# Patient Record
Sex: Female | Born: 1940 | Race: White | Hispanic: No
Health system: Southern US, Community
[De-identification: ages and names within clinical notes are randomized; demographics above are authoritative.]

---

## 1997-09-28 ENCOUNTER — Other Ambulatory Visit: Admission: RE | Admit: 1997-09-28 | Discharge: 1997-09-28 | Payer: Self-pay | Admitting: *Deleted

## 1997-11-02 ENCOUNTER — Ambulatory Visit: Admission: RE | Admit: 1997-11-02 | Discharge: 1997-11-02 | Payer: Self-pay | Admitting: Cardiology

## 1998-10-03 ENCOUNTER — Other Ambulatory Visit: Admission: RE | Admit: 1998-10-03 | Discharge: 1998-10-03 | Payer: Self-pay | Admitting: *Deleted

## 1999-03-05 ENCOUNTER — Emergency Department (HOSPITAL_COMMUNITY): Admission: EM | Admit: 1999-03-05 | Discharge: 1999-03-05 | Payer: Self-pay

## 1999-04-27 ENCOUNTER — Ambulatory Visit (HOSPITAL_COMMUNITY): Admission: RE | Admit: 1999-04-27 | Discharge: 1999-04-27 | Payer: Self-pay | Admitting: *Deleted

## 1999-10-04 ENCOUNTER — Other Ambulatory Visit: Admission: RE | Admit: 1999-10-04 | Discharge: 1999-10-04 | Payer: Self-pay | Admitting: *Deleted

## 1999-10-27 ENCOUNTER — Encounter: Admission: RE | Admit: 1999-10-27 | Discharge: 1999-10-27 | Payer: Self-pay | Admitting: Internal Medicine

## 1999-10-27 ENCOUNTER — Encounter: Payer: Self-pay | Admitting: Internal Medicine

## 2000-10-29 ENCOUNTER — Encounter: Admission: RE | Admit: 2000-10-29 | Discharge: 2000-10-29 | Payer: Self-pay | Admitting: Internal Medicine

## 2000-10-29 ENCOUNTER — Encounter: Payer: Self-pay | Admitting: Internal Medicine

## 2001-10-30 ENCOUNTER — Encounter: Payer: Self-pay | Admitting: *Deleted

## 2001-10-30 ENCOUNTER — Encounter: Admission: RE | Admit: 2001-10-30 | Discharge: 2001-10-30 | Payer: Self-pay | Admitting: *Deleted

## 2002-11-02 ENCOUNTER — Encounter: Payer: Self-pay | Admitting: Obstetrics and Gynecology

## 2002-11-02 ENCOUNTER — Encounter: Admission: RE | Admit: 2002-11-02 | Discharge: 2002-11-02 | Payer: Self-pay | Admitting: Obstetrics and Gynecology

## 2003-11-03 ENCOUNTER — Encounter: Admission: RE | Admit: 2003-11-03 | Discharge: 2003-11-03 | Payer: Self-pay | Admitting: Obstetrics and Gynecology

## 2004-06-29 ENCOUNTER — Ambulatory Visit (HOSPITAL_COMMUNITY): Admission: RE | Admit: 2004-06-29 | Discharge: 2004-06-29 | Payer: Self-pay | Admitting: *Deleted

## 2004-11-15 ENCOUNTER — Encounter: Admission: RE | Admit: 2004-11-15 | Discharge: 2004-11-15 | Payer: Self-pay | Admitting: Internal Medicine

## 2005-11-20 ENCOUNTER — Encounter: Admission: RE | Admit: 2005-11-20 | Discharge: 2005-11-20 | Payer: Self-pay | Admitting: Internal Medicine

## 2006-11-22 ENCOUNTER — Encounter: Admission: RE | Admit: 2006-11-22 | Discharge: 2006-11-22 | Payer: Self-pay | Admitting: Internal Medicine

## 2007-11-25 ENCOUNTER — Encounter: Admission: RE | Admit: 2007-11-25 | Discharge: 2007-11-25 | Payer: Self-pay | Admitting: Internal Medicine

## 2008-11-25 ENCOUNTER — Encounter: Admission: RE | Admit: 2008-11-25 | Discharge: 2008-11-25 | Payer: Self-pay | Admitting: Internal Medicine

## 2009-11-28 ENCOUNTER — Encounter: Admission: RE | Admit: 2009-11-28 | Discharge: 2009-11-28 | Payer: Self-pay | Admitting: Internal Medicine

## 2010-08-04 NOTE — Op Note (Signed)
NAMEATLAS, KUC                  ACCOUNT NO.:  [id number]   MEDICAL RECORD NO.:  [id number]          PATIENT TYPE:  AMB   LOCATION:  ENDO                         FACILITY:  [facility]   PHYSICIAN:  Georgiana Spinner, M.D.    DATE OF BIRTH:  [birth year]   DATE OF PROCEDURE:  06/29/2004  DATE OF DISCHARGE:                                 OPERATIVE REPORT   PROCEDURE:  Colonoscopy.   INDICATIONS:  Cancer screening.   ANESTHESIA:  1.  Demerol 60.  2.  Versed 4 mg.   PROCEDURE:  With the patient mildly sedated in the left lateral decubitus  position, the Olympus video  colonoscope was inserted into the rectum and  passed under direct vision to the cecum, identified by the ileocecal valve  and appendiceal orifice, both of which were photographed.  From this point,  the colonoscope was slowly withdrawn, taking circumferential views of the  colonic mucosa, stopping in the rectum which appeared normal on direct and  showed hemorrhoids on retroflexed views.  The endoscope was straightened and  withdrawn.  The patient's vital signs and pulse oximeter remained stable.  The patient tolerated the procedure well, with no apparent complications.   FINDINGS:  Internal hemorrhoids.  Otherwise unremarkable colonoscopic  examination to the cecum.   PLAN:  Consider repeat examination in 5-10 years.      GMO/MEDQ  D:  06/29/2004  T:  06/29/2004  Job:  [id number]

## 2010-10-31 ENCOUNTER — Other Ambulatory Visit: Payer: Self-pay | Admitting: Internal Medicine

## 2010-10-31 DIAGNOSIS — Z1231 Encounter for screening mammogram for malignant neoplasm of breast: Secondary | ICD-10-CM

## 2010-12-01 ENCOUNTER — Ambulatory Visit
Admission: RE | Admit: 2010-12-01 | Discharge: 2010-12-01 | Disposition: A | Discharge status: Home / Self Care | Payer: Medicare Other | Source: Ambulatory Visit | Attending: Internal Medicine | Admitting: Internal Medicine

## 2010-12-01 DIAGNOSIS — Z1231 Encounter for screening mammogram for malignant neoplasm of breast: Secondary | ICD-10-CM

## 2011-10-22 ENCOUNTER — Other Ambulatory Visit: Payer: Self-pay | Admitting: Internal Medicine

## 2011-10-22 DIAGNOSIS — Z1231 Encounter for screening mammogram for malignant neoplasm of breast: Secondary | ICD-10-CM

## 2011-12-10 ENCOUNTER — Ambulatory Visit
Admission: RE | Admit: 2011-12-10 | Discharge: 2011-12-10 | Disposition: A | Discharge status: Home / Self Care | Payer: Medicare Other | Source: Ambulatory Visit | Attending: Internal Medicine | Admitting: Internal Medicine

## 2011-12-10 DIAGNOSIS — Z1231 Encounter for screening mammogram for malignant neoplasm of breast: Secondary | ICD-10-CM

## 2012-06-18 ENCOUNTER — Other Ambulatory Visit (HOSPITAL_COMMUNITY)
Admission: RE | Admit: 2012-06-18 | Discharge: 2012-06-18 | Disposition: A | Discharge status: Home / Self Care | Payer: Medicare Other | Source: Ambulatory Visit | Attending: Internal Medicine | Admitting: Internal Medicine

## 2012-06-18 DIAGNOSIS — Z01419 Encounter for gynecological examination (general) (routine) without abnormal findings: Secondary | ICD-10-CM | POA: Insufficient documentation

## 2012-06-18 DIAGNOSIS — Z1151 Encounter for screening for human papillomavirus (HPV): Secondary | ICD-10-CM | POA: Insufficient documentation

## 2012-11-03 ENCOUNTER — Other Ambulatory Visit: Payer: Self-pay

## 2012-11-03 DIAGNOSIS — Z1231 Encounter for screening mammogram for malignant neoplasm of breast: Secondary | ICD-10-CM

## 2012-12-10 ENCOUNTER — Ambulatory Visit
Admission: RE | Admit: 2012-12-10 | Discharge: 2012-12-10 | Disposition: A | Discharge status: Home / Self Care | Payer: Medicare Other | Source: Ambulatory Visit

## 2012-12-10 DIAGNOSIS — Z1231 Encounter for screening mammogram for malignant neoplasm of breast: Secondary | ICD-10-CM

## 2013-08-14 ENCOUNTER — Other Ambulatory Visit: Payer: Self-pay | Admitting: Internal Medicine

## 2013-08-17 ENCOUNTER — Ambulatory Visit
Admission: RE | Admit: 2013-08-17 | Discharge: 2013-08-17 | Disposition: A | Discharge status: Home / Self Care | Payer: Medicare Other | Source: Ambulatory Visit | Attending: Internal Medicine | Admitting: Internal Medicine

## 2013-11-06 ENCOUNTER — Other Ambulatory Visit: Payer: Self-pay

## 2013-11-06 DIAGNOSIS — Z1231 Encounter for screening mammogram for malignant neoplasm of breast: Secondary | ICD-10-CM

## 2013-12-11 ENCOUNTER — Ambulatory Visit
Admission: RE | Admit: 2013-12-11 | Discharge: 2013-12-11 | Disposition: A | Discharge status: Home / Self Care | Payer: Medicare Other | Source: Ambulatory Visit

## 2013-12-11 DIAGNOSIS — Z1231 Encounter for screening mammogram for malignant neoplasm of breast: Secondary | ICD-10-CM

## 2015-06-04 IMAGING — US US CAROTID DUPLEX BILAT
1 series · 13 of 24 positions shown · non-contrast
Comparison: None.

CLINICAL DATA: Calcification is noted in the neck on a plain
radiographs of the cervical spine suggests atherosclerotic disease
in the carotid arteries

EXAM:
BILATERAL CAROTID DUPLEX ULTRASOUND
TECHNIQUE: Gray scale imaging, color Doppler and duplex ultrasound were
performed of bilateral carotid and vertebral arteries in the neck.

[Series 1: us carotid duplex bilat · 0.10mm/px · 13 of 71 slices shown]
[im 1/71]
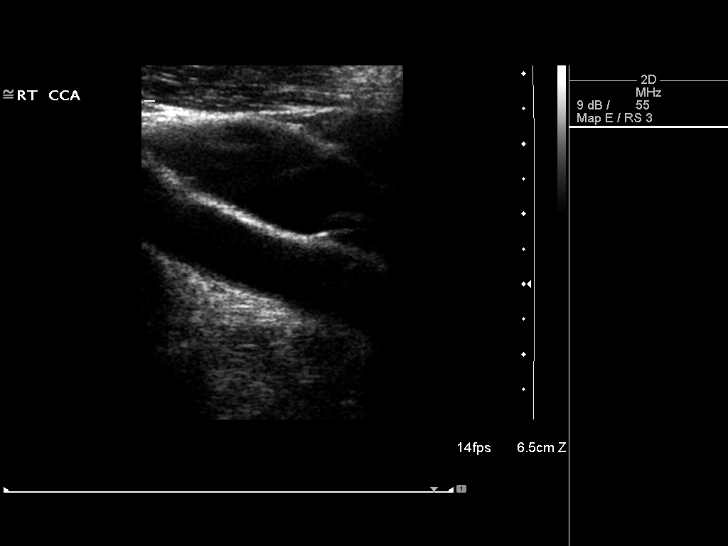
[im 7/71]
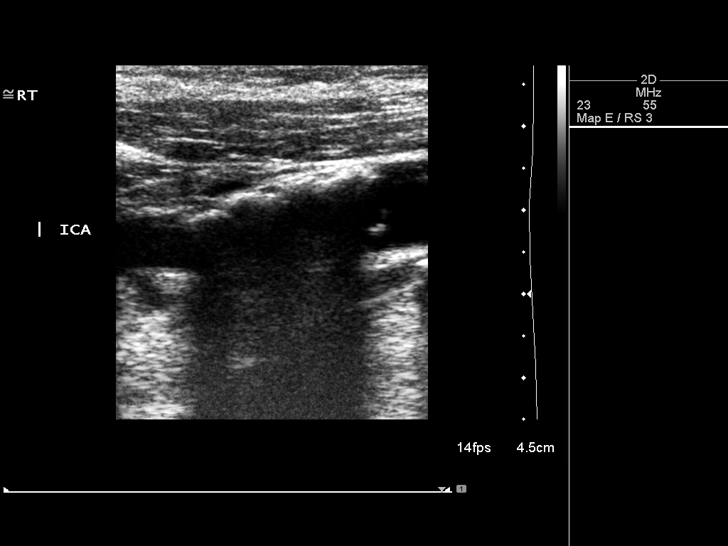
[im 13/71]
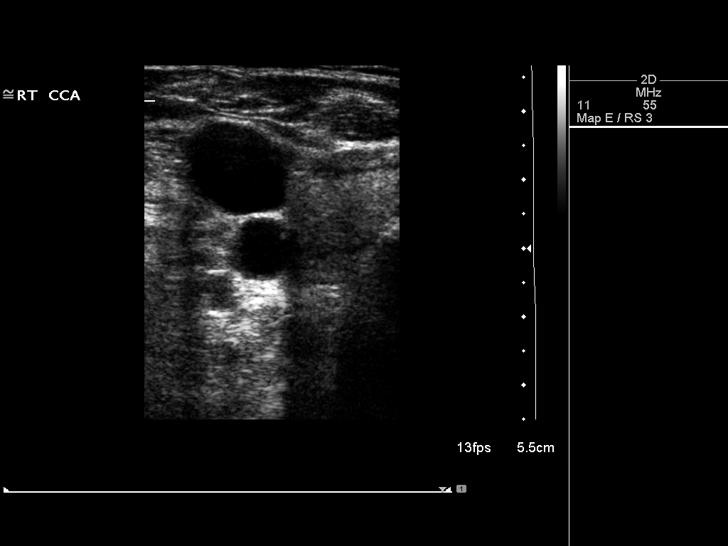
[im 19/71]
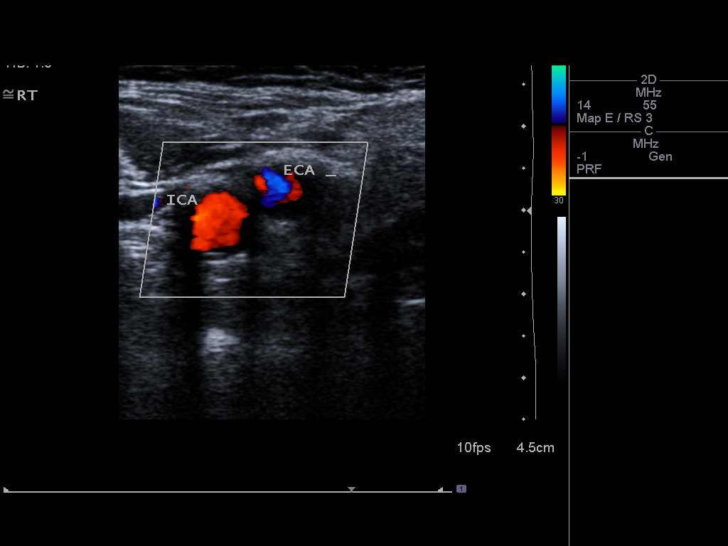
[im 25/71]
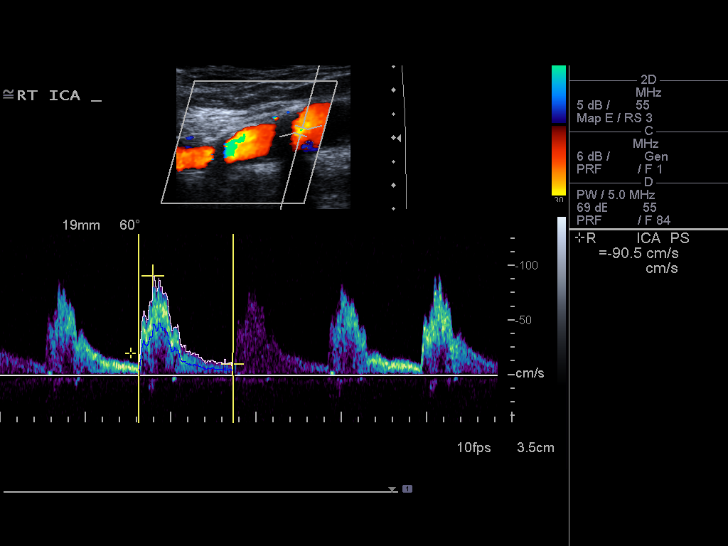
[im 31/71]
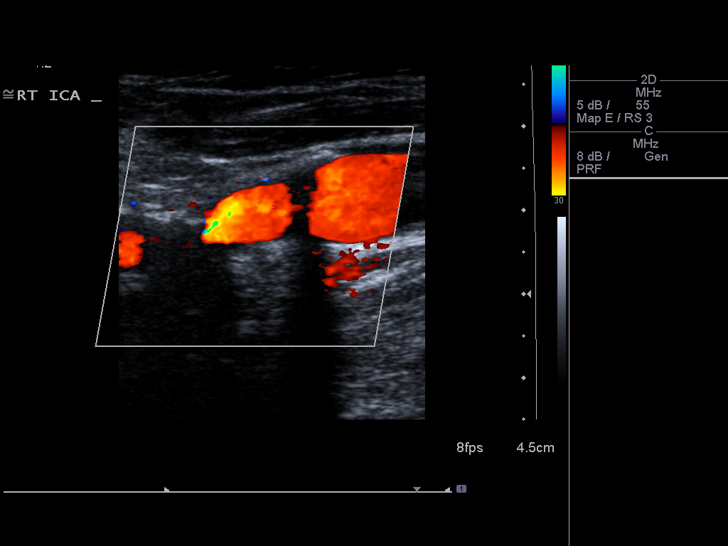
[im 37/71]
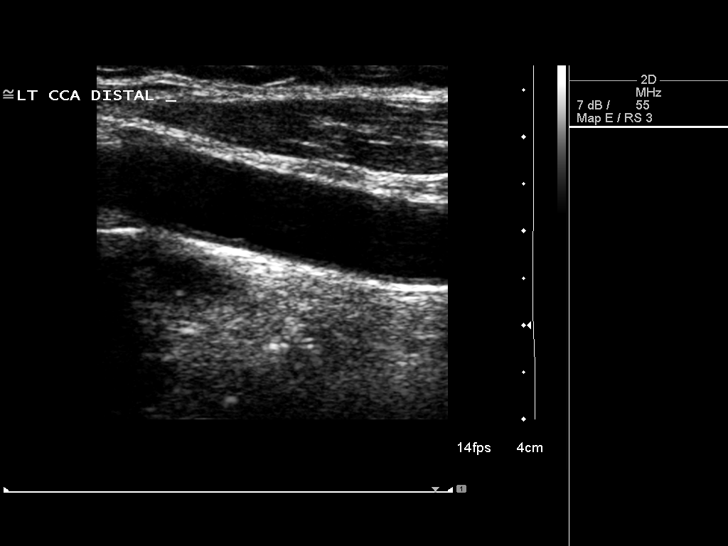
[im 40/71]
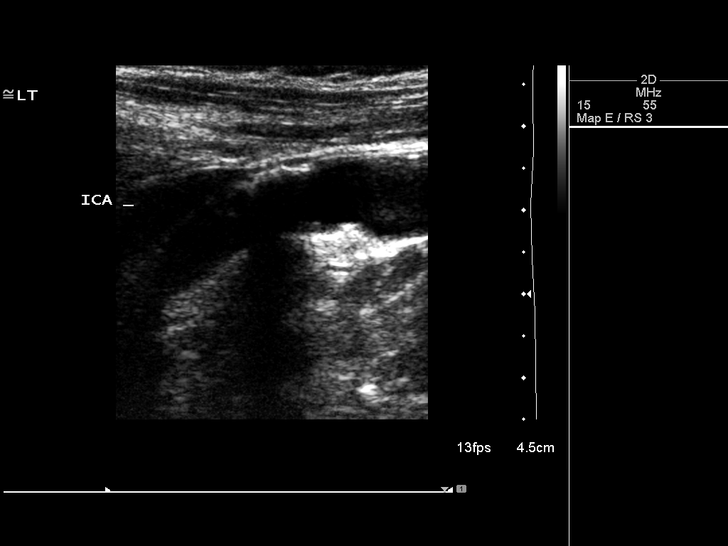
[im 46/71]
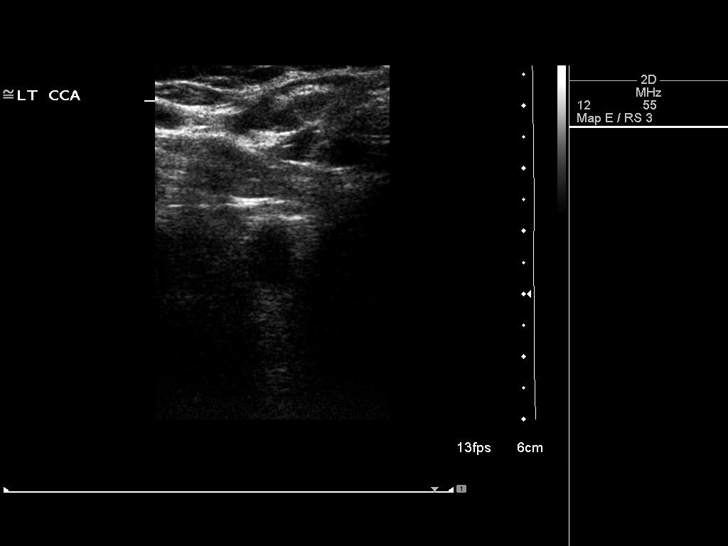
[im 52/71]
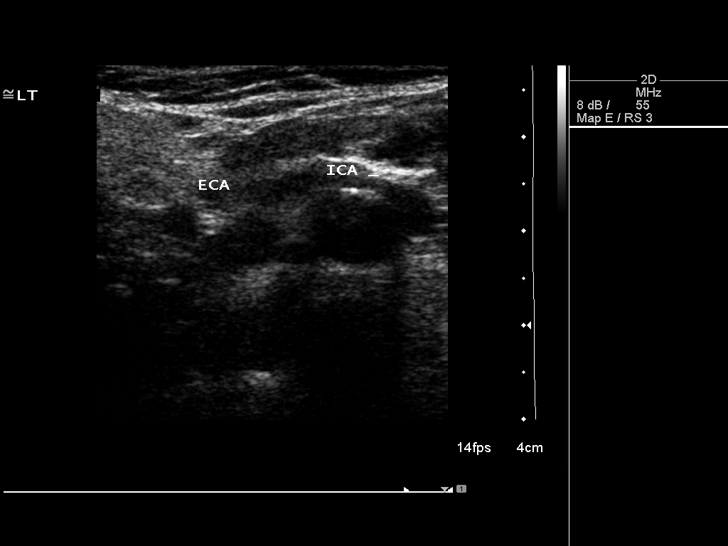
[im 58/71]
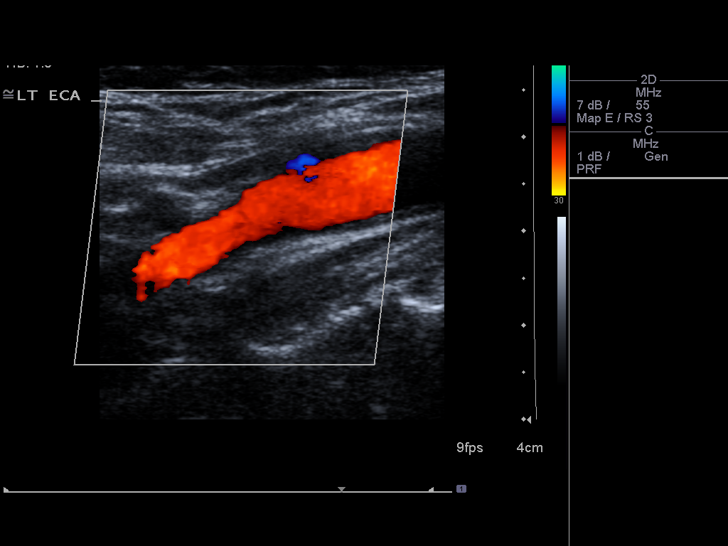
[im 64/71]
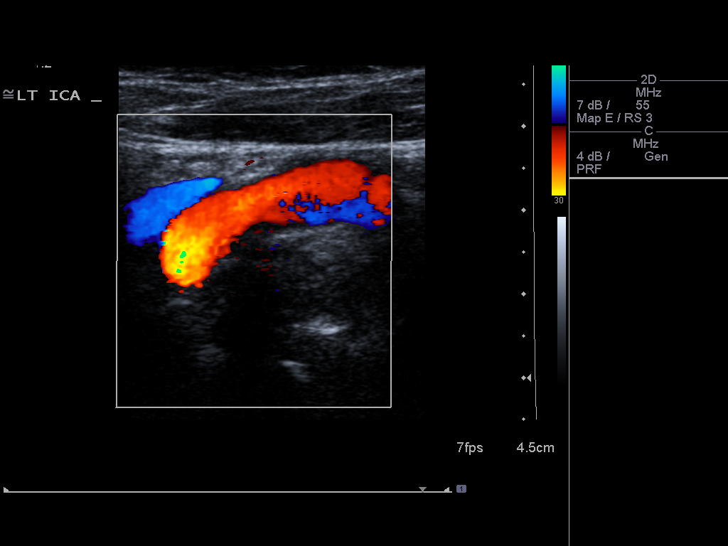
[im 71/71]
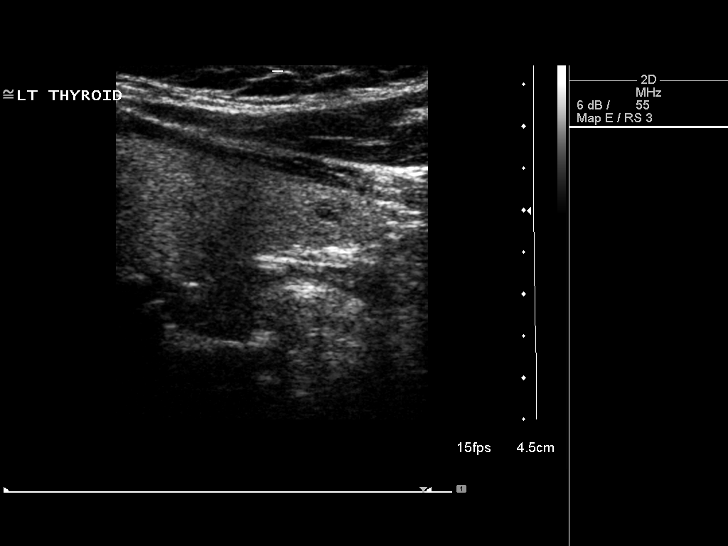

[13 of 24 positions shown; findings below may reference images not displayed]

FINDINGS: Criteria: Quantification of carotid stenosis is based on velocity
parameters that correlate the residual internal carotid diameter
with NASCET-based stenosis levels, using the diameter of the distal
internal carotid lumen as the denominator for stenosis measurement.

The following velocity measurements were obtained:

RIGHT

ICA:  105 cm/sec

CCA:  9 cm/sec

SYSTOLIC ICA/CCA RATIO:

DIASTOLIC ICA/CCA RATIO:

ECA:  126 cm/sec

LEFT

ICA:  119 cm/sec

CCA:  112 cm/sec

SYSTOLIC ICA/CCA RATIO:

DIASTOLIC ICA/CCA RATIO:

ECA:  75 cm/sec

RIGHT CAROTID ARTERY: There are calcifications in the upper common
carotid. Moderate calcified plaque in the bulb. Low resistance
internal carotid Doppler pattern.

RIGHT VERTEBRAL ARTERY:  Antegrade.  Normal Doppler pattern.

LEFT CAROTID ARTERY: Mild calcified plaque in the common carotid and
bulb. Low resistance internal carotid Doppler pattern.

LEFT VERTEBRAL ARTERY:  Antegrade.  Normal Doppler pattern.

Additional findings: 4 mm nodule in the left lobe of the thyroid
gland.
IMPRESSION: Less than 50% stenosis in the right and left internal carotid
arteries. There is plaque in both bulbous and common carotids.

4 mm left thyroid nodule. Findings do not meet current SRU consensus
criteria for biopsy. Follow-up by clinical exam is recommended. If
patient has known risk factors for thyroid carcinoma, consider
follow-up ultrasound in 12 months. If patient is clinically
hyperthyroid, consider nuclear medicine thyroid uptake and scan.

Reference: Management of Thyroid Nodules Detected at US: Society of
Radiologists in Ultrasound Consensus Conference Statement. Radiology
